# Patient Record
Sex: Female | Born: 1980 | Race: White | Hispanic: No | Marital: Married | Smoking: Never smoker
Health system: Southern US, Community
[De-identification: ages and names within clinical notes are randomized; demographics above are authoritative.]

## PROBLEM LIST (undated history)

## (undated) DIAGNOSIS — M1990 Unspecified osteoarthritis, unspecified site: Secondary | ICD-10-CM

## (undated) HISTORY — PX: TONSILLECTOMY: SUR1361

---

## 2004-12-01 ENCOUNTER — Ambulatory Visit: Payer: Self-pay | Admitting: Internal Medicine

## 2005-01-28 ENCOUNTER — Ambulatory Visit: Payer: Self-pay | Admitting: Internal Medicine

## 2005-11-16 ENCOUNTER — Ambulatory Visit: Payer: Self-pay | Admitting: Family Medicine

## 2008-01-21 ENCOUNTER — Encounter (INDEPENDENT_AMBULATORY_CARE_PROVIDER_SITE_OTHER): Payer: Self-pay | Admitting: *Deleted

## 2008-02-01 ENCOUNTER — Ambulatory Visit: Payer: Self-pay | Admitting: Family Medicine

## 2008-02-01 DIAGNOSIS — J309 Allergic rhinitis, unspecified: Secondary | ICD-10-CM | POA: Insufficient documentation

## 2008-02-01 DIAGNOSIS — J45909 Unspecified asthma, uncomplicated: Secondary | ICD-10-CM | POA: Insufficient documentation

## 2008-02-04 ENCOUNTER — Encounter (INDEPENDENT_AMBULATORY_CARE_PROVIDER_SITE_OTHER): Payer: Self-pay | Admitting: *Deleted

## 2008-02-04 LAB — CONVERTED CEMR LAB
ALT: 18 units/L (ref 0–35)
AST: 21 units/L (ref 0–37)
Albumin: 4.1 g/dL (ref 3.5–5.2)
Alkaline Phosphatase: 52 units/L (ref 39–117)
BUN: 11 mg/dL (ref 6–23)
Basophils Absolute: 0.1 10*3/uL (ref 0.0–0.1)
Basophils Relative: 1 % (ref 0–1)
Bilirubin, Direct: 0.1 mg/dL (ref 0.0–0.3)
CO2: 21 meq/L (ref 19–32)
Calcium: 10 mg/dL (ref 8.4–10.5)
Chloride: 101 meq/L (ref 96–112)
Cholesterol: 187 mg/dL (ref 0–200)
Creatinine, Ser: 0.79 mg/dL (ref 0.40–1.20)
Eosinophils Absolute: 0.2 10*3/uL (ref 0.0–0.7)
Eosinophils Relative: 2 % (ref 0–5)
Glucose, Bld: 74 mg/dL (ref 70–99)
HCT: 39.7 % (ref 36.0–46.0)
HDL: 62 mg/dL (ref >39)
Hemoglobin: 13 g/dL (ref 12.0–15.0)
Indirect Bilirubin: 0.8 mg/dL (ref 0.0–0.9)
LDL Cholesterol: 112 mg/dL — ABNORMAL HIGH (ref 0–99)
Lymphocytes Relative: 37 % (ref 12–46)
Lymphs Abs: 3 10*3/uL (ref 0.7–4.0)
MCHC: 32.7 g/dL (ref 30.0–36.0)
MCV: 95.9 fL (ref 78.0–100.0)
Monocytes Absolute: 0.7 10*3/uL (ref 0.1–1.0)
Monocytes Relative: 9 % (ref 3–12)
Neutro Abs: 4.2 10*3/uL (ref 1.7–7.7)
Neutrophils Relative %: 52 % (ref 43–77)
Platelets: 393 10*3/uL (ref 150–400)
Potassium: 4.1 meq/L (ref 3.5–5.3)
RBC: 4.14 M/uL (ref 3.87–5.11)
RDW: 13.2 % (ref 11.5–15.5)
Sodium: 135 meq/L (ref 135–145)
TSH: 2.114 microintl units/mL (ref 0.350–4.50)
Total Bilirubin: 0.9 mg/dL (ref 0.3–1.2)
Total CHOL/HDL Ratio: 3
Total Protein: 7.1 g/dL (ref 6.0–8.3)
Triglycerides: 65 mg/dL (ref <150)
VLDL: 13 mg/dL (ref 0–40)
WBC: 8.1 10*3/uL (ref 4.0–10.5)

## 2009-02-05 ENCOUNTER — Ambulatory Visit: Payer: Self-pay | Admitting: Sports Medicine

## 2009-02-05 DIAGNOSIS — M79609 Pain in unspecified limb: Secondary | ICD-10-CM | POA: Insufficient documentation

## 2009-02-05 DIAGNOSIS — IMO0002 Reserved for concepts with insufficient information to code with codable children: Secondary | ICD-10-CM | POA: Insufficient documentation

## 2009-08-11 ENCOUNTER — Ambulatory Visit (HOSPITAL_COMMUNITY): Admission: RE | Admit: 2009-08-11 | Discharge: 2009-08-11 | Payer: Self-pay | Admitting: Obstetrics and Gynecology

## 2009-08-15 ENCOUNTER — Inpatient Hospital Stay (HOSPITAL_COMMUNITY): Admission: AD | Admit: 2009-08-15 | Discharge: 2009-08-15 | Payer: Self-pay | Admitting: Obstetrics and Gynecology

## 2010-03-05 ENCOUNTER — Inpatient Hospital Stay (HOSPITAL_COMMUNITY)
Admission: AD | Admit: 2010-03-05 | Discharge: 2010-03-08 | Payer: Self-pay | Source: Home / Self Care | Attending: Obstetrics & Gynecology | Admitting: Obstetrics & Gynecology

## 2010-03-08 LAB — CBC
HCT: 38.8 % (ref 36.0–46.0)
HCT: 29.1 % — ABNORMAL LOW (ref 36.0–46.0)
Hemoglobin: 13.2 g/dL (ref 12.0–15.0)
Hemoglobin: 10.1 g/dL — ABNORMAL LOW (ref 12.0–15.0)
MCH: 32 pg (ref 26.0–34.0)
MCH: 32.3 pg (ref 26.0–34.0)
MCHC: 34 g/dL (ref 30.0–36.0)
MCHC: 34.7 g/dL (ref 30.0–36.0)
MCV: 94.2 fL (ref 78.0–100.0)
MCV: 93 fL (ref 78.0–100.0)
Platelets: 313 10*3/uL (ref 150–400)
Platelets: 233 10*3/uL (ref 150–400)
RBC: 4.12 MIL/uL (ref 3.87–5.11)
RBC: 3.13 MIL/uL — ABNORMAL LOW (ref 3.87–5.11)
RDW: 13.3 % (ref 11.5–15.5)
RDW: 13.5 % (ref 11.5–15.5)
WBC: 10.2 10*3/uL (ref 4.0–10.5)
WBC: 11.8 10*3/uL — ABNORMAL HIGH (ref 4.0–10.5)

## 2010-03-08 LAB — RPR: RPR Ser Ql: NONREACTIVE

## 2010-03-11 ENCOUNTER — Encounter
Admission: RE | Admit: 2010-03-11 | Discharge: 2010-03-23 | Payer: Self-pay | Source: Home / Self Care | Attending: Obstetrics and Gynecology | Admitting: Obstetrics and Gynecology

## 2010-03-17 ENCOUNTER — Ambulatory Visit
Admission: RE | Admit: 2010-03-17 | Discharge: 2010-03-17 | Payer: Self-pay | Source: Home / Self Care | Admitting: Obstetrics and Gynecology

## 2010-04-11 ENCOUNTER — Encounter (HOSPITAL_COMMUNITY)
Admission: RE | Admit: 2010-04-11 | Discharge: 2010-04-11 | Disposition: A | Discharge status: Home / Self Care | Payer: Self-pay | Source: Ambulatory Visit | Attending: Obstetrics and Gynecology | Admitting: Obstetrics and Gynecology

## 2010-04-11 DIAGNOSIS — O923 Agalactia: Secondary | ICD-10-CM | POA: Insufficient documentation

## 2011-01-23 ENCOUNTER — Encounter: Payer: Self-pay | Admitting: *Deleted

## 2011-01-23 ENCOUNTER — Emergency Department (INDEPENDENT_AMBULATORY_CARE_PROVIDER_SITE_OTHER): Payer: BC Managed Care – PPO

## 2011-01-23 ENCOUNTER — Emergency Department (HOSPITAL_BASED_OUTPATIENT_CLINIC_OR_DEPARTMENT_OTHER)
Admission: EM | Admit: 2011-01-23 | Discharge: 2011-01-23 | Disposition: A | Discharge status: Home / Self Care | Payer: BC Managed Care – PPO | Attending: Emergency Medicine | Admitting: Emergency Medicine

## 2011-01-23 DIAGNOSIS — S92309A Fracture of unspecified metatarsal bone(s), unspecified foot, initial encounter for closed fracture: Secondary | ICD-10-CM | POA: Insufficient documentation

## 2011-01-23 DIAGNOSIS — W010XXA Fall on same level from slipping, tripping and stumbling without subsequent striking against object, initial encounter: Secondary | ICD-10-CM | POA: Insufficient documentation

## 2011-01-23 DIAGNOSIS — S92323A Displaced fracture of second metatarsal bone, unspecified foot, initial encounter for closed fracture: Secondary | ICD-10-CM

## 2011-01-23 DIAGNOSIS — Y9302 Activity, running: Secondary | ICD-10-CM | POA: Insufficient documentation

## 2011-01-23 DIAGNOSIS — X58XXXA Exposure to other specified factors, initial encounter: Secondary | ICD-10-CM

## 2011-01-23 DIAGNOSIS — M79609 Pain in unspecified limb: Secondary | ICD-10-CM

## 2011-01-23 HISTORY — DX: Unspecified osteoarthritis, unspecified site: M19.90

## 2011-01-23 MED ORDER — HYDROCODONE-ACETAMINOPHEN 5-500 MG PO TABS: 1.0000 | ORAL_TABLET | Freq: Four times a day (QID) | ORAL | Status: AC | PRN

## 2011-01-23 NOTE — ED Provider Notes (Signed)
History     CSN: [id number] Arrival date & time: 01/23/2011  1:48 PM   First MD Initiated Contact with Patient 01/23/11 1418      No chief complaint on file.   (Consider location/radiation/quality/duration/timing/severity/associated sxs/prior treatment) HPI Comments: Pt states that she has a history of RA since her pregnancy and now she is has problems with fractures:pt state that there was not any actually definite event:but the foot swelled and has continued to hurt  Patient is a 30 y.o. female presenting with foot injury. The history is provided by the patient. No language interpreter was used.  Foot Injury  The incident occurred yesterday. Incident location: running a half marathon. The injury mechanism is unknown. The pain is present in the left foot. The pain is at a severity of 8/10. The pain is moderate. The pain has been constant since onset. Pertinent negatives include no loss of motion. She reports no foreign bodies present. The symptoms are aggravated by bearing weight and activity. She has tried nothing for the symptoms.    No past medical history on file.  No past surgical history on file.  No family history on file.  History  Substance Use Topics  . Smoking status: Not on file  . Smokeless tobacco: Not on file  . Alcohol Use: Not on file    OB History    No data available      Review of Systems  All other systems reviewed and are negative.    Allergies  Ibuprofen  Home Medications  No current outpatient prescriptions on file.  BP 134/74  Pulse 52  Temp(Src) 98 F (36.7 C) (Oral)  Resp 16  Ht 5\' 1"  (1.549 m)  Wt 125 lb (56.7 kg)  BMI 23.62 kg/m2  SpO2 100%  Physical Exam  Nursing note and vitals reviewed. Constitutional: She is oriented to person, place, and time. She appears well-developed and well-nourished.  HENT:  Head: Normocephalic and atraumatic.  Cardiovascular: Normal rate and regular rhythm.   Pulmonary/Chest: Effort normal and  breath sounds normal.  Musculoskeletal: Normal range of motion.       Pt has obvious swelling and tenderness noted to the top of the left foot:pulses intact  Neurological: She is alert and oriented to person, place, and time.  Skin: Skin is warm and dry.    ED Course  Procedures (including critical care time)  Labs Reviewed - No data to display Dg Foot Complete Left  01/23/2011  *RADIOLOGY REPORT*  Clinical Data: Anterior foot pain, bruising.  LEFT FOOT - COMPLETE 3+ VIEW  Comparison: None  Findings: There is a fracture through the distal shaft of the left second metatarsal.  Slight displacement.  No additional acute bony abnormality.  IMPRESSION: Minimally-displaced distal left second metatarsal shaft fracture.  Original Report Authenticated By: Cyndie Chime, M.D.     1. Closed fracture of second metatarsal bone       MDM  Pt splinted by nursing staff:pt to follow up with ortho        Teressa Lower, NP 01/23/11 1437

## 2011-01-23 NOTE — ED Provider Notes (Signed)
Medical screening examination/treatment/procedure(s) were performed by non-physician practitioner and as supervising physician I was immediately available for consultation/collaboration.   Mahari Strahm, MD 01/23/11 1438 

## 2011-01-23 NOTE — ED Notes (Signed)
Ran in a foot race yesterday and hurt L foot

## 2016-04-07 ENCOUNTER — Other Ambulatory Visit: Payer: Self-pay | Admitting: Allergy

## 2016-04-07 ENCOUNTER — Ambulatory Visit (INDEPENDENT_AMBULATORY_CARE_PROVIDER_SITE_OTHER): Payer: 59

## 2016-04-07 DIAGNOSIS — J45909 Unspecified asthma, uncomplicated: Secondary | ICD-10-CM

## 2016-04-07 DIAGNOSIS — J4530 Mild persistent asthma, uncomplicated: Secondary | ICD-10-CM

## 2017-06-20 IMAGING — DX DG CHEST 2V
2 series · 2 of 2 positions shown · non-contrast
Comparison: None in PACs.

CLINICAL DATA: Two months of cough with only partial improvement on
steroids. History of asthma and rheumatoid arthritis. Nonsmoker.

EXAM:
CHEST  2 VIEW

[chest pa]
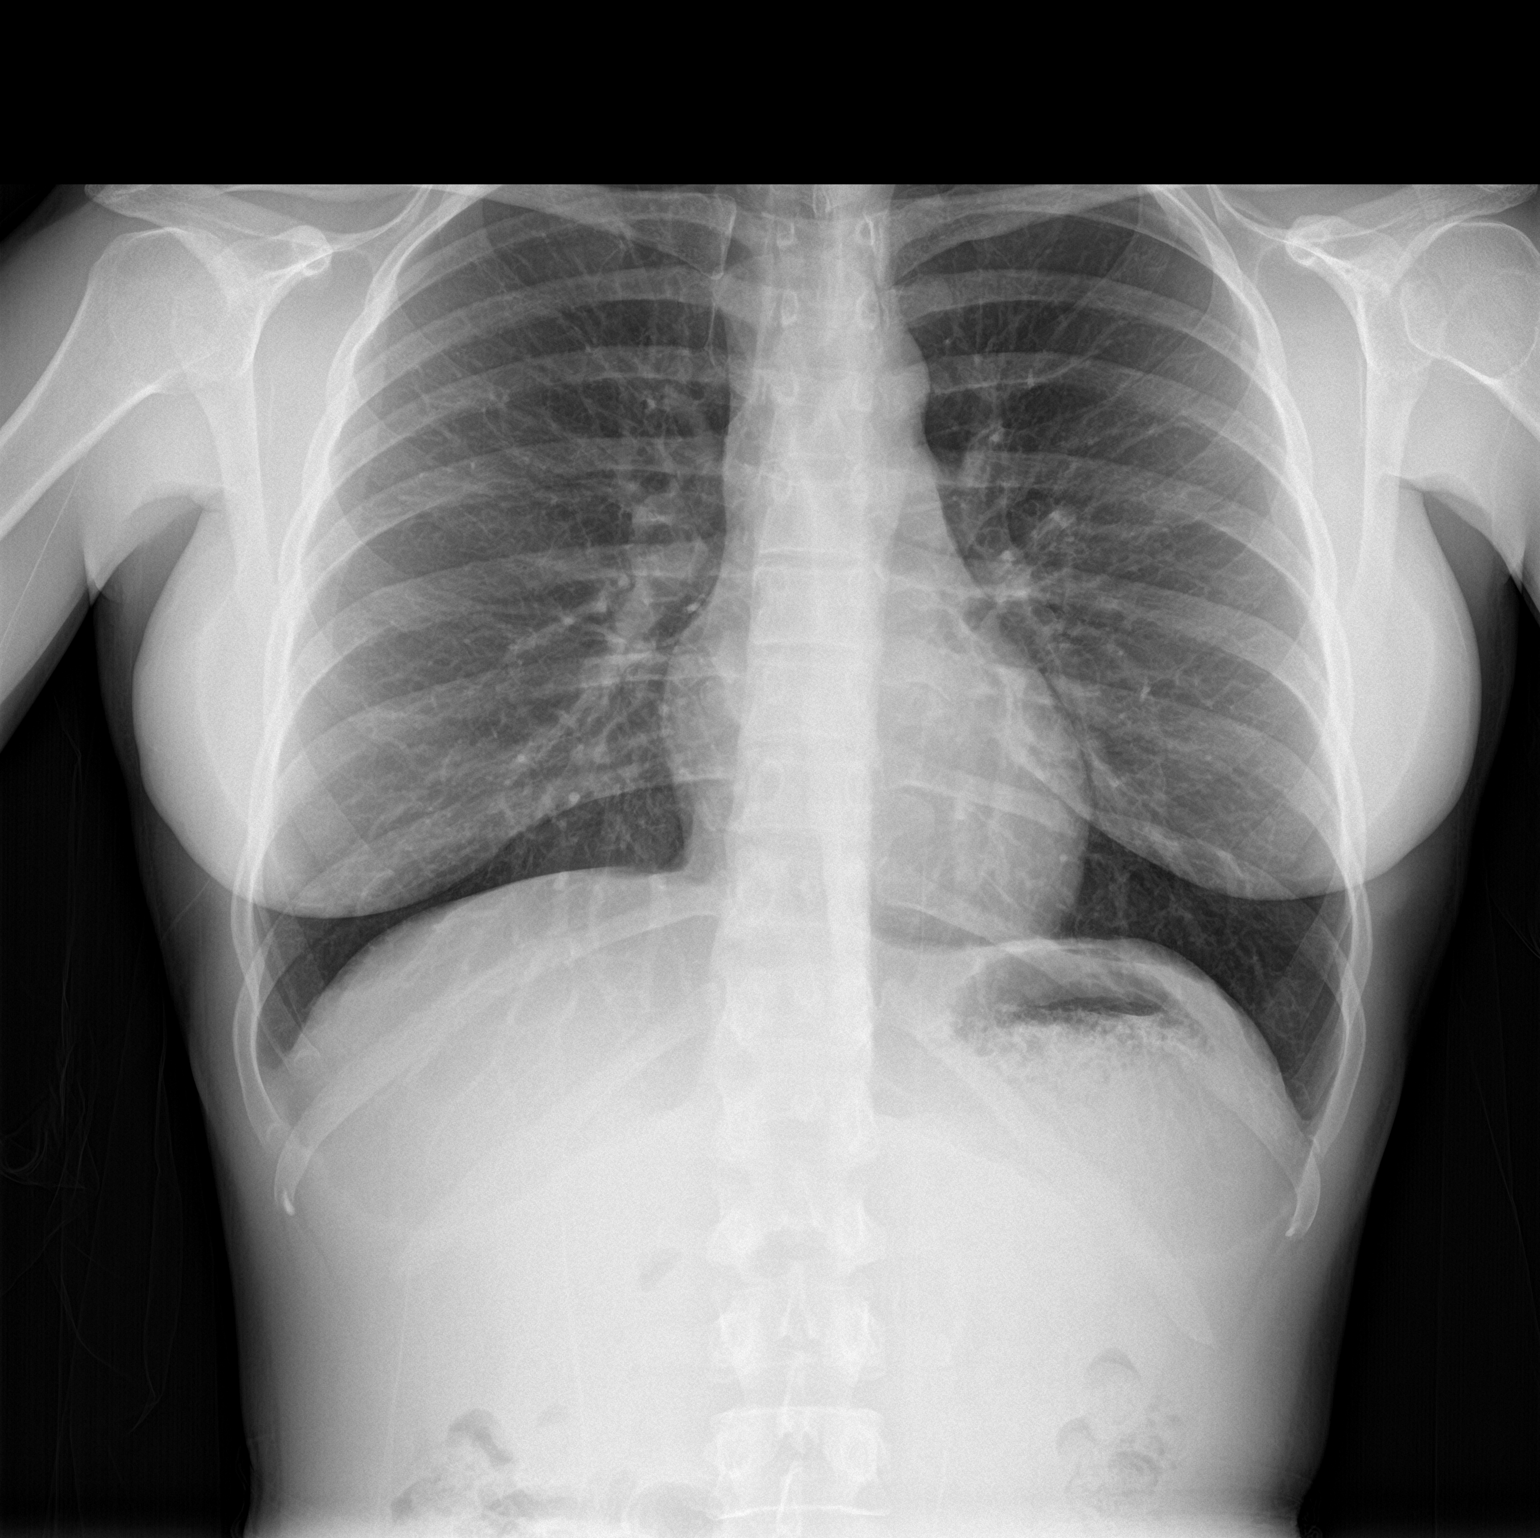

[chest lat]
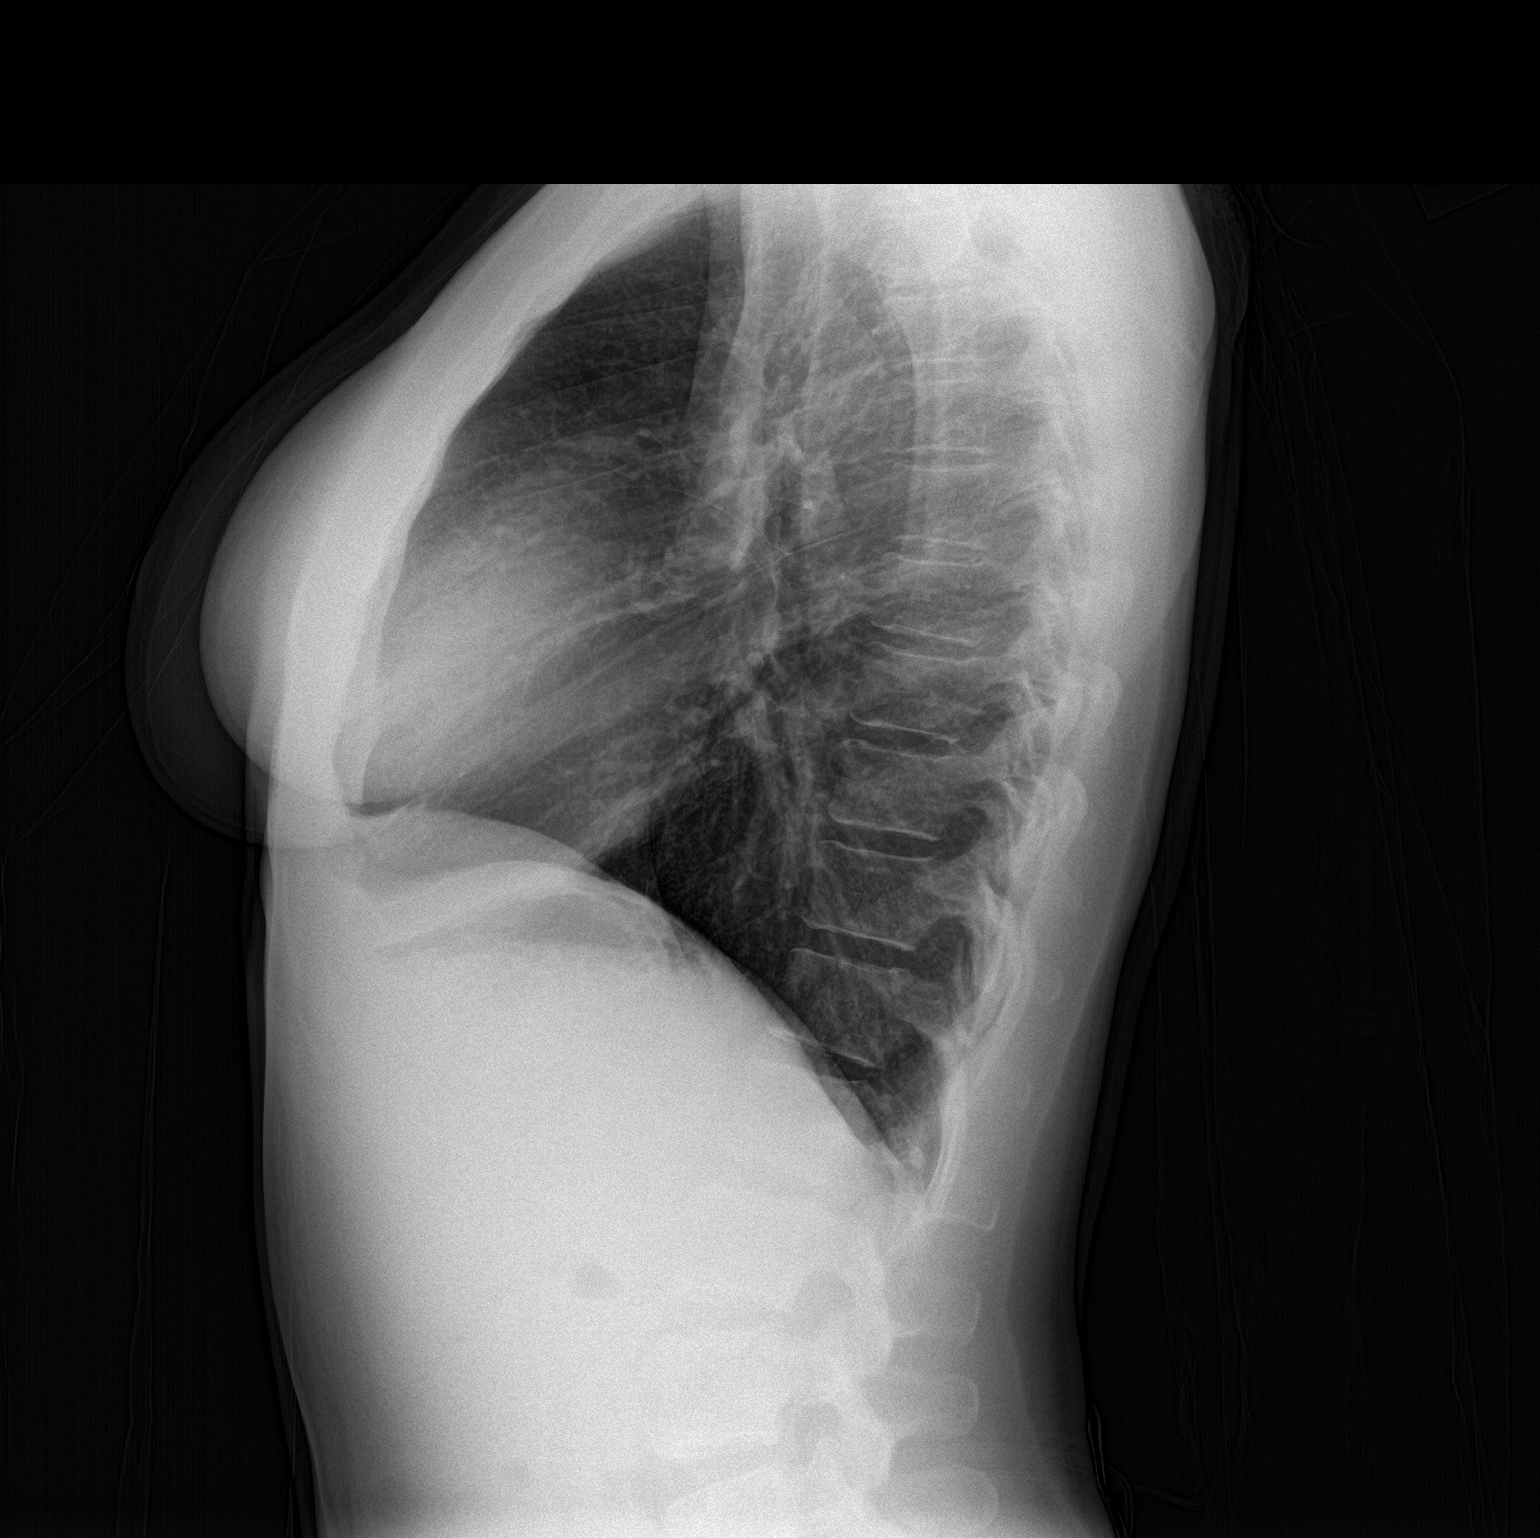

[2 of 2 positions shown; findings below may reference images not displayed]

FINDINGS: The lungs are mildly hyperinflated. There is no focal infiltrate.
There is no pleural effusion. No pulmonary parenchymal nodules or
masses are observed. The heart and pulmonary vascularity are normal.
The mediastinum is normal in width. The bony thorax exhibits no
acute abnormality.
IMPRESSION: Mild hyperinflation consistent with reactive airway disease. No
pneumonia, CHF, nor other acute cardiopulmonary abnormality.

## 2018-06-06 ENCOUNTER — Other Ambulatory Visit: Payer: Self-pay

## 2018-06-06 ENCOUNTER — Ambulatory Visit (INDEPENDENT_AMBULATORY_CARE_PROVIDER_SITE_OTHER): Payer: 59 | Admitting: Family Medicine

## 2018-06-06 ENCOUNTER — Encounter (INDEPENDENT_AMBULATORY_CARE_PROVIDER_SITE_OTHER): Payer: Self-pay | Admitting: Family Medicine

## 2018-06-06 ENCOUNTER — Ambulatory Visit (INDEPENDENT_AMBULATORY_CARE_PROVIDER_SITE_OTHER): Payer: Self-pay

## 2018-06-06 DIAGNOSIS — M79661 Pain in right lower leg: Secondary | ICD-10-CM | POA: Diagnosis not present

## 2018-06-06 DIAGNOSIS — M069 Rheumatoid arthritis, unspecified: Secondary | ICD-10-CM

## 2018-06-06 NOTE — Patient Instructions (Signed)
    Vitamin D3:  Take 5,000 IU tablets, 2 of them daily for 3 months and then 1 daily lon-term after that.  Vitamin K2:  Take 100 mcg daily.  Magnesium:  Take 400 mg daily

## 2018-06-06 NOTE — Progress Notes (Signed)
Office Visit Note   Patient: Nancy Nichols           Date of Birth: 1980-03-04           MRN: [id number] Visit Date: 06/06/2018 Requested by: No referring provider defined for this encounter. PCP: Patient, No Pcp Per  Subjective: Chief Complaint  Patient presents with  . Right Lower Leg - Pain    Pain medial lower leg x 9 months and 3 days.  Referred by [facility] PT. Is a runner.    HPI: She is a 38 year old runner with right lower leg pain.  Symptoms started about 8 or 9 months ago on a mild basis, becoming progressively more intense.  About 2 months ago she started significantly modifying her activities but unfortunately her pain has not gone away.  Every time she tries to run 1 mile, her pain comes right back.  Pain is on the distal anterior medial aspect of the tibia.  She has not seen any bruising or swelling.  She has a history of rheumatoid arthritis and asthma and has been on high doses of prednisone for long periods of time in her life.  She has never had a bone density test, has never had a stress fracture but she has broken other bones due to sports trauma in the past.  She takes calcium supplements.  She has never had her vitamin D levels checked.               ROS: Denies fevers or chills or respiratory symptoms.  All other systems were reviewed and are negative.  Objective: Vital Signs: There were no vitals taken for this visit.  Physical Exam:  General:  Alert and oriented, in no acute distress. Pulm:  Breathing unlabored. Psy:  Normal mood, congruent affect. Skin: No rash on her skin. Right leg: There is no visible swelling compared to the left.  She is point tender to bony palpation on the distal medial tibia, probably 10 cm proximal to the medial malleolus.  There is no pain with supination of the foot, dorsiflexion of the ankle, or plantarflexion of the great toe against resistance.  Imaging: X-rays right tibia/fibula: No definite stress fracture seen.  Limited  diagnostic ultrasound: No cortical irregularities seen in the area of pain.  No soft tissue swelling.  No hyperemia on power Doppler imaging.    Assessment & Plan: 1.  Right distal medial tibia pain, concerning for stress fracture. -Discussed options with patient, elected to pursue MRI scan to confirm diagnosis.  Depending on the results, could contemplate a bone growth stimulator to facilitate healing.  Regardless, she will take higher dose vitamin D3, vitamin K 2 and magnesium for bone healing.     Procedures: No procedures performed  No notes on file     PMFS History: Patient Active Problem List   Diagnosis Date Noted  . Rheumatoid arthritis (HCC) 06/06/2018  . LEG PAIN, BILATERAL 02/05/2009  . SHIN SPLINTS 02/05/2009  . ALLERGIC RHINITIS 02/01/2008  . ASTHMA 02/01/2008   Past Medical History:  Diagnosis Date  . Arthritis     History reviewed. No pertinent family history.  Past Surgical History:  Procedure Laterality Date  . TONSILLECTOMY     Social History   Occupational History  . Not on file  Tobacco Use  . Smoking status: Never Smoker  Substance and Sexual Activity  . Alcohol use: No  . Drug use: No  . Sexual activity: Yes    Birth control/protection:  None

## 2018-07-13 ENCOUNTER — Telehealth: Payer: Self-pay | Admitting: Family Medicine

## 2018-07-13 NOTE — Telephone Encounter (Signed)
Patient needing orders changed for MRI insurance and GI have orders wrong. Wants to know if Dr can change orders so she can get her MRI and insurance can pay their part. Patient would also like it if someone would contact her letting her know when its done

## 2018-07-13 NOTE — Telephone Encounter (Signed)
Can you help with this?

## 2018-07-18 ENCOUNTER — Telehealth: Payer: Self-pay | Admitting: Family Medicine

## 2018-07-18 NOTE — Telephone Encounter (Signed)
Pt has been called and everything explained to her, see phone note 07/18/18

## 2018-07-18 NOTE — Telephone Encounter (Signed)
I contacted pt back and advised her what Everardo All with [facility] representative has told me about reason she was denied and he stated the dx has nothing to do with the case being denied that they were looking for 6 week trial of conservative tx and xray. I asked if could do a peer to peer and he stated that the time line for Peer to Peer has expired but can do an Appeal and he is faxing me over information about doing the appeal. Pt states she is on phone with her insurance co and they are telling her the dx code has to be changed because it say general pain of leg.  Number to appeals is [phone]

## 2018-07-18 NOTE — Telephone Encounter (Signed)
Pt calling regarding the dx of the MRI was incorrect a her MRI was denied. She has called several time to get dx changed. She will like to be released from Dr [facility] care if the MRI can not be done ASAP. Pt's call back # [phone]

## 2018-07-18 NOTE — Telephone Encounter (Signed)
Please call this patient, as per our discussion on this.

## 2020-10-05 ENCOUNTER — Ambulatory Visit (INDEPENDENT_AMBULATORY_CARE_PROVIDER_SITE_OTHER): Payer: Self-pay | Admitting: Plastic Surgery

## 2020-10-05 ENCOUNTER — Other Ambulatory Visit: Payer: Self-pay

## 2020-10-05 VITALS — BP 90/60 | Temp 98.0°F | Resp 16 | Ht 61.0 in | Wt 122.0 lb

## 2020-10-05 DIAGNOSIS — M069 Rheumatoid arthritis, unspecified: Secondary | ICD-10-CM

## 2020-10-05 DIAGNOSIS — Z719 Counseling, unspecified: Secondary | ICD-10-CM

## 2020-10-05 NOTE — Progress Notes (Signed)
Patient ID: Nancy Nichols, female    DOB: 07-Jun-1980, 40 y.o.   MRN: [id number]   Chief Complaint  Patient presents with   Advice Only    The patient is a 40 year old female here for evaluation of her breasts.  The patient had saline implants placed in 2006 by Dr. Benna Dunks.  They are between 300-320 cc.  They are submuscular and were placed through an inframammary incision.  In 2011 the patient was diagnosed with rheumatoid arthritis for which she takes immunosuppressive medication.  She had a mammogram in July 2022 and it was negative.  She is 5 feet 1 inch tall and weighs 122 pounds.  She is otherwise in very good health and does extreme running.  She often runs 70 miles a week.  She has not had any other recent surgery.  She wants to be healthy an A cup prior to the implants placement and is OK with the thought of being that size again.  She is interested in implant removal and mastopexy.   Review of Systems  Constitutional: Negative.   HENT: Negative.    Eyes: Negative.   Respiratory: Negative.    Cardiovascular: Negative.  Negative for leg swelling.  Gastrointestinal: Negative.  Negative for abdominal pain.  Endocrine: Negative.   Genitourinary: Negative.   Musculoskeletal: Negative.   Skin: Negative.  Negative for color change and wound.  Neurological: Negative.   Hematological: Negative.   Psychiatric/Behavioral: Negative.     Past Medical History:  Diagnosis Date   Arthritis     Past Surgical History:  Procedure Laterality Date   TONSILLECTOMY        Current Outpatient Medications:    ACTEMRA 162 MG/0.9ML SOSY, , Disp: , Rfl:    ALBUTEROL SULFATE PO, Take by mouth.  , Disp: , Rfl:    Cetirizine HCl (ZYRTEC ALLERGY PO), Take by mouth.  , Disp: , Rfl:    levalbuterol (XOPENEX) 1.25 MG/3ML nebulizer solution, , Disp: , Rfl:    montelukast (SINGULAIR) 10 MG tablet, , Disp: , Rfl:    PREDNISONE, PAK, PO, Take by mouth. Prn for arthritis , Disp: , Rfl:    SYMBICORT  160-4.5 MCG/ACT inhaler, , Disp: , Rfl:    Objective:   Vitals:   10/05/20 1120  BP: 90/60  Resp: 16  Temp: 98 F (36.7 C)  SpO2: 100%    Physical Exam Vitals and nursing note reviewed.  Constitutional:      Appearance: Normal appearance.  HENT:     Head: Normocephalic and atraumatic.  Cardiovascular:     Rate and Rhythm: Normal rate.     Pulses: Normal pulses.  Pulmonary:     Effort: Pulmonary effort is normal. No respiratory distress.  Abdominal:     General: Abdomen is flat. There is no distension.     Tenderness: There is no abdominal tenderness.  Musculoskeletal:        General: No deformity.  Skin:    General: Skin is warm.     Capillary Refill: Capillary refill takes less than 2 seconds.     Coloration: Skin is not jaundiced.     Findings: No bruising.  Neurological:     General: No focal deficit present.     Mental Status: She is alert and oriented to person, place, and time.  Psychiatric:        Mood and Affect: Mood normal.        Behavior: Behavior normal.  Thought Content: Thought content normal.    Assessment & Plan:  Rheumatoid arthritis, involving unspecified site, unspecified whether rheumatoid factor present (HCC)  Encounter for counseling I think the patient is being very reasonable.  She is a good candidate for bilateral implant removal and mastopexy.  We will provide her with a quote.  She will have to move back into the running slowly.  Pictures were obtained of the patient and placed in the chart with the patient's or guardian's permission.   Nancy Bills [name], DO

## 2020-10-14 DIAGNOSIS — Z719 Counseling, unspecified: Secondary | ICD-10-CM

## 2021-01-22 ENCOUNTER — Ambulatory Visit (INDEPENDENT_AMBULATORY_CARE_PROVIDER_SITE_OTHER): Payer: No Typology Code available for payment source | Admitting: Physician Assistant

## 2021-01-22 ENCOUNTER — Encounter: Payer: Self-pay | Admitting: Physician Assistant

## 2021-01-22 ENCOUNTER — Other Ambulatory Visit: Payer: Self-pay

## 2021-01-22 VITALS — BP 120/70 | HR 57 | Ht 61.0 in | Wt 128.0 lb

## 2021-01-22 DIAGNOSIS — Z719 Counseling, unspecified: Secondary | ICD-10-CM

## 2021-01-22 DIAGNOSIS — Z9882 Breast implant status: Secondary | ICD-10-CM

## 2021-01-22 MED ORDER — CEPHALEXIN 500 MG PO CAPS: 500.0000 mg | ORAL_CAPSULE | Freq: Four times a day (QID) | ORAL | 0 refills | Status: AC

## 2021-01-22 MED ORDER — HYDROCODONE-ACETAMINOPHEN 5-325 MG PO TABS: 1.0000 | ORAL_TABLET | Freq: Four times a day (QID) | ORAL | 0 refills | Status: AC | PRN

## 2021-01-22 MED ORDER — ONDANSETRON 4 MG PO TBDP: 4.0000 mg | ORAL_TABLET | Freq: Three times a day (TID) | ORAL | 0 refills | Status: AC | PRN

## 2021-01-22 NOTE — Progress Notes (Signed)
Patient ID: Nancy Nichols, female    DOB: [birth year], 40 y.o.   MRN: [id number]  Chief Complaint  Patient presents with   Pre-op Exam      ICD-10-CM   1. History of bilateral saline breast implants  Z98.82        History of Present Illness: Nancy Nichols is a 40 y.o.  female  with a history of submuscular saline implant placement in 2006.  She presents for preoperative evaluation for upcoming procedure, implant removal with mastopexy, scheduled for 02/04/2021 with Dr. Ulice Bold.  The patient has not had problems with anesthesia.  Patient is a marathon runner.  She has well-controlled asthma, but denies any other lung disease.  She also is already on Actemra, plans to hold 1 month before and after surgery.  She is already discussed this with her rheumatologist, as well.  She would like to proceed with explant of her saline implants as well as mastopexy.  She also wants to confirm that she will have capsulectomy in addition to the implant removal.  She denies any personal or family history of blood clots or clotting disorder.  She has never had cancer.  Denies varicose veins.  She is not on any hormone replacement or birth control.  NKDA.  Summary of Previous Visit: She was seen for initial consult 10/05/2020.  At that time, it was noted that her submuscular saline implants are approximately 300 to 320 cc.  She takes immunosuppressive medication for RA.  Mammogram obtained 08/2020 was negative.  She is in good health.  She reports that she is okay with being in A cup after surgery.  She expressed desire to proceed with bilateral implant removal with mastopexy.  Job: Exercise trainer.  PMH Significant for: S/p implant placement 2006, rheumatoid arthritis on Actemra.   Past Medical History: Allergies: No Known Allergies  Current Medications:  Current Outpatient Medications:    ACTEMRA 162 MG/0.9ML SOSY, , Disp: , Rfl:    ALBUTEROL SULFATE PO, Take by mouth.  , Disp: , Rfl:     Cetirizine HCl (ZYRTEC ALLERGY PO), Take by mouth.  , Disp: , Rfl:    levalbuterol (XOPENEX) 1.25 MG/3ML nebulizer solution, , Disp: , Rfl:    montelukast (SINGULAIR) 10 MG tablet, , Disp: , Rfl:    PREDNISONE, PAK, PO, Take by mouth. Prn for arthritis , Disp: , Rfl:    SYMBICORT 160-4.5 MCG/ACT inhaler, , Disp: , Rfl:   Past Medical Problems: Past Medical History:  Diagnosis Date   Arthritis     Past Surgical History: Past Surgical History:  Procedure Laterality Date   TONSILLECTOMY      Social History: Social History   Socioeconomic History   Marital status: Married    Spouse name: Not on file   Number of children: Not on file   Years of education: Not on file   Highest education level: Not on file  Occupational History   Not on file  Tobacco Use   Smoking status: Never   Smokeless tobacco: Not on file  Substance and Sexual Activity   Alcohol use: No   Drug use: No   Sexual activity: Yes    Birth control/protection: None  Other Topics Concern   Not on file  Social History Narrative   Not on file   Social Determinants of Health   Financial Resource Strain: Not on file  Food Insecurity: Not on file  Transportation Needs: Not on file  Physical Activity: Not  on file  Stress: Not on file  Social Connections: Not on file  Intimate Partner Violence: Not on file    Family History: No family history on file.  Review of Systems: ROS Denies recent illness or infection, fevers or chills, or rash.  Physical Exam: Vital Signs BP 120/70 (BP Location: Left Arm, Patient Position: Sitting, Cuff Size: Normal)   Pulse (!) 57   Ht 5\' 1"  (1.549 m)   Wt 128 lb (58.1 kg)   SpO2 100%   BMI 24.19 kg/m   Physical Exam Constitutional:      General: Not in acute distress.    Appearance: Normal appearance. Not ill-appearing.  HENT:     Head: Normocephalic and atraumatic.  Eyes:     Pupils: Pupils are equal, round Neck:     Musculoskeletal: Normal range of motion.   Cardiovascular:     Rate and Rhythm: Normal rate    Pulses: Normal pulses.  Pulmonary:     Effort: Pulmonary effort is normal. No respiratory distress.  Abdominal:     General: Abdomen is flat. There is no distension.  Musculoskeletal: Normal range of motion.  No lower extremity swelling or edema. Skin:    General: Skin is warm and dry.     Findings: No erythema or rash.  Neurological:     General: No focal deficit present.     Mental Status: Alert and oriented to person, place, and time. Mental status is at baseline.     Motor: No weakness.  Psychiatric:        Mood and Affect: Mood normal.        Behavior: Behavior normal.    Assessment/Plan: The patient is scheduled for bilateral implant removal with mastopexy 02/04/2021 with Dr. 02/06/2021.  Risks, benefits, and alternatives of procedure discussed, questions answered and consent obtained.    Smoking Status: Non-smoker. Last Mammogram: 08/2020; Results: Negative mammogram per initial consult.  Cannot verify in chart.  Caprini Score: 3, moderate; Risk Factors include: Age and length of planned surgery. Recommendation for mechanical prophylaxis. Encourage early ambulation.   Pictures obtained: 10/05/2020  Post-op Rx sent to pharmacy: Norco, Zofran, Keflex.  Patient was provided with the General Surgical Risk consent document and Pain Medication Agreement prior to their appointment.  They had adequate time to read through the risk consent documents and Pain Medication Agreement. We also discussed them in person together during this preop appointment. All of their questions were answered to their satisfaction.  Recommended calling if they have any further questions.  Risk consent form and Pain Medication Agreement to be scanned into patient's chart.  The risk that can be encountered with mastopexy/breast lift were discussed and include the following but not limited to these:  Breast asymmetry, fluid accumulation, firmness of the  breast, inability to breast feed, loss of nipple or areola, skin loss, decrease or no nipple sensation, fat necrosis of the breast tissue, bleeding, infection, healing delay.  There are risks of anesthesia, changes to skin sensation and injury to nerves or blood vessels.  The muscle can be temporarily or permanently injured.  You may have an allergic reaction to tape, suture, glue, blood products which can result in skin discoloration, swelling, pain, skin lesions, poor healing.  Any of these can lead to the need for revisonal surgery or stage procedures.  A mastopexy has potential to interfere with diagnostic procedures.  Nipple or breast piercing can increase risks of infection.  This procedure is best done when the breast is  fully developed.  Changes in the breast will continue to occur over time.  Pregnancy can alter the outcomes of previous breast surgery, weight gain and weigh loss can also effect the long term appearance.     Electronically signed by: Evelena Leyden, PA-C 01/22/2021 2:54 PM

## 2021-02-12 ENCOUNTER — Other Ambulatory Visit: Payer: Self-pay

## 2021-02-12 ENCOUNTER — Encounter: Payer: Self-pay | Admitting: Plastic Surgery

## 2021-02-12 ENCOUNTER — Ambulatory Visit (INDEPENDENT_AMBULATORY_CARE_PROVIDER_SITE_OTHER): Payer: Self-pay | Admitting: Plastic Surgery

## 2021-02-12 DIAGNOSIS — Z719 Counseling, unspecified: Secondary | ICD-10-CM

## 2021-02-12 NOTE — Progress Notes (Signed)
° °  Subjective:    Patient ID: Nancy Nichols, female    DOB: 06-Aug-1980, 40 y.o.   MRN: [id number]  The patient is a 40 year old female here for follow-up after breast surgery last week.  The patient had bilateral implants removed and mastopexy.  The implants were placed in 2006 by Dr. Benna Dunks.  She was diagnosed with rheumatoid arthritis in 2011.  She is very athletic and 5 feet 1 inch tall.  She weighs 122 pounds.  She wanted to have the implants removed.  Today she has some bruising and the drains are in place with minimal output.  She said she already feels much better and her skin seems to be doing better as well.  Her pain is well controlled.     Review of Systems  Eyes: Negative.   Respiratory: Negative.    Endocrine: Negative.   Genitourinary: Negative.       Objective:   Physical Exam Constitutional:      Appearance: Normal appearance.  Cardiovascular:     Rate and Rhythm: Normal rate.     Pulses: Normal pulses.  Pulmonary:     Effort: Pulmonary effort is normal.  Skin:    Findings: Bruising present.  Neurological:     Mental Status: She is alert and oriented to person, place, and time.  Psychiatric:        Mood and Affect: Mood normal.        Behavior: Behavior normal.       Assessment & Plan:     ICD-10-CM   1. Encounter for counseling  Z71.9        We were able to remove both drains.  We will see her back in 2 weeks.  She can remove the dressing in a week but keep the Steri-Strips in place.  She can also go into a sports bra if she feels more comfortable.

## 2021-02-16 ENCOUNTER — Ambulatory Visit (INDEPENDENT_AMBULATORY_CARE_PROVIDER_SITE_OTHER): Payer: No Typology Code available for payment source | Admitting: Physician Assistant

## 2021-02-16 ENCOUNTER — Other Ambulatory Visit: Payer: Self-pay

## 2021-02-16 DIAGNOSIS — Z9882 Breast implant status: Secondary | ICD-10-CM

## 2021-02-16 NOTE — Progress Notes (Signed)
Patient is a 40 year old female s/p bilateral breast implant removal and mastopexy performed 02/04/2021 by Dr. Ulice Bold who presents to clinic for postoperative follow-up.  She was last seen here in clinic on 02/12/2021 at which time she reported that she was doing well.  Drains removed without complication.  Plan was for dressing removal in 1 week and transition into a sports bra if more comfortable.  Recommending continued activity modifications.  Patient presents today with no specific complaints.  She states that she is here for her dressing removal.  The Mepilex border dressing had begun to fall off in the shower and she was hoping for some assistance with its removal.  The underlying Steri-Strips have also been saturated and are falling off with the dressing.  Denies any pain, redness, fevers, or other symptoms otherwise concerning for infection.  She is looking forward to restrictions being lifted so that she can return to her rigorous exercise activity.  Patient no longer requires analgesics.  Physical exam is entirely reassuring.  Dressings were removed without complication or difficulty.  No wounds or dehiscence appreciated.  No drainage.  Residual Dermabond around NAC's.  Encouraging ABD pad followed by compressive bra for the next 1 to 2 weeks.  She already has an appointment scheduled for 03/02/2021.  At that time, she can transition to Vaseline to help dissolve the residual Dermabond.  She will need postoperative photos at next encounter.

## 2021-03-02 ENCOUNTER — Ambulatory Visit (INDEPENDENT_AMBULATORY_CARE_PROVIDER_SITE_OTHER): Payer: Self-pay | Admitting: Surgical

## 2021-03-02 ENCOUNTER — Other Ambulatory Visit: Payer: Self-pay

## 2021-03-02 DIAGNOSIS — Z9882 Breast implant status: Secondary | ICD-10-CM

## 2021-03-02 DIAGNOSIS — Z719 Counseling, unspecified: Secondary | ICD-10-CM

## 2021-03-02 NOTE — Progress Notes (Signed)
41 year old female here for follow-up after removal of bilateral breast implants and bilateral mastopexy with Dr. Marla Roe on 02/04/2021.  She is approximately 4 weeks postop.  She is overall doing really well.  She has some questions about returning to exercising. She is going to restart her rheumatoid medications shortly, has discussed this with her rheumatologist.  Chaperone present on exam On exam bilateral NAC's are viable, bilateral breast incisions are intact and well-healed.  There is no erythema or cellulitic changes.  No subcutaneous fluid collections noted palpation.  There are few residual Monocryl knots noted.  We discussed ongoing restrictions for 2 more weeks, avoid any strenuous activities or heavy lifting.  Continue to wear compressive garments for 2 more weeks.  She can begin sleeping on her side or stomach as tolerated.  Incisions are well-healed.  No signs of infection on exam.  Recommend following up on an as-needed basis, call with questions or concerns.  Patient is comfortable with this.  Pictures were taken and placed in the patient's chart with patient's permission.

## 2021-03-26 DIAGNOSIS — Z719 Counseling, unspecified: Secondary | ICD-10-CM
